# Patient Record
Sex: Male | Born: 1955 | Race: Black or African American | Hispanic: No | Marital: Married | Smoking: Former smoker
Health system: Southern US, Community
[De-identification: ages and names within clinical notes are randomized; demographics above are authoritative.]

## PROBLEM LIST (undated history)

## (undated) DIAGNOSIS — I10 Essential (primary) hypertension: Secondary | ICD-10-CM

## (undated) HISTORY — PX: OTHER SURGICAL HISTORY: SHX169

---

## 2003-09-04 ENCOUNTER — Ambulatory Visit (HOSPITAL_COMMUNITY): Admission: RE | Admit: 2003-09-04 | Discharge: 2003-09-04 | Payer: Self-pay | Admitting: Urology

## 2004-06-13 ENCOUNTER — Emergency Department (HOSPITAL_COMMUNITY): Admission: EM | Admit: 2004-06-13 | Discharge: 2004-06-13 | Payer: Self-pay | Admitting: Emergency Medicine

## 2004-06-22 ENCOUNTER — Ambulatory Visit (HOSPITAL_COMMUNITY): Admission: RE | Admit: 2004-06-22 | Discharge: 2004-06-22 | Payer: Self-pay | Admitting: Urology

## 2004-06-23 ENCOUNTER — Ambulatory Visit (HOSPITAL_BASED_OUTPATIENT_CLINIC_OR_DEPARTMENT_OTHER): Admission: RE | Admit: 2004-06-23 | Discharge: 2004-06-23 | Payer: Self-pay | Admitting: Urology

## 2004-10-12 ENCOUNTER — Ambulatory Visit (HOSPITAL_BASED_OUTPATIENT_CLINIC_OR_DEPARTMENT_OTHER): Admission: RE | Admit: 2004-10-12 | Discharge: 2004-10-12 | Payer: Self-pay | Admitting: Urology

## 2004-10-12 ENCOUNTER — Ambulatory Visit (HOSPITAL_COMMUNITY): Admission: RE | Admit: 2004-10-12 | Discharge: 2004-10-12 | Payer: Self-pay | Admitting: Urology

## 2006-11-08 IMAGING — CT CT ABDOMEN W/O CM
1 of 2 series · 15 of 32 positions shown, 19 images · non-contrast
Comparison: 09/04/03.

CLINICAL DATA: Rt sided flank pain.
TECHNIQUE: Multidetector helical scanning was performed without oral or intravenous contrast.  Lack of intravenous contrast limits the detection of pathology within the solid organs.

[Series 3436: — · axial · 0.69mm/px · z∈[+1396,+1826]mm · 15 of 96 slices shown, 19 images]
[im 5/96  soft-tissue]
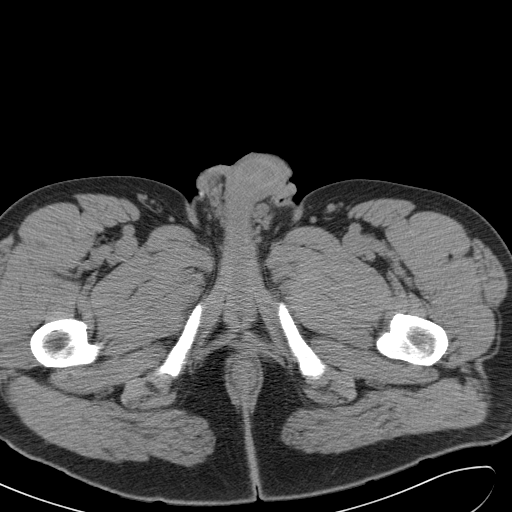
[im 5/96  bone]
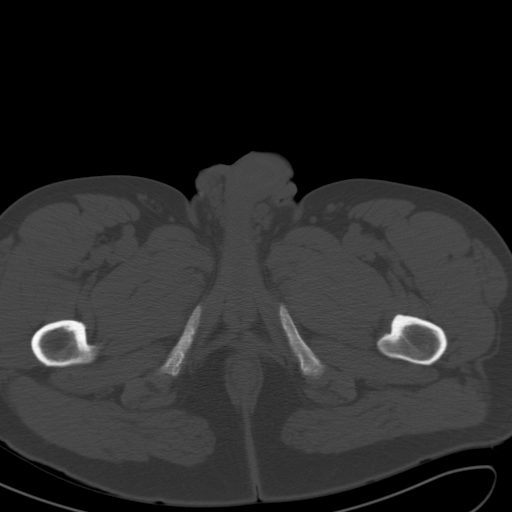
[im 13/96  soft-tissue]
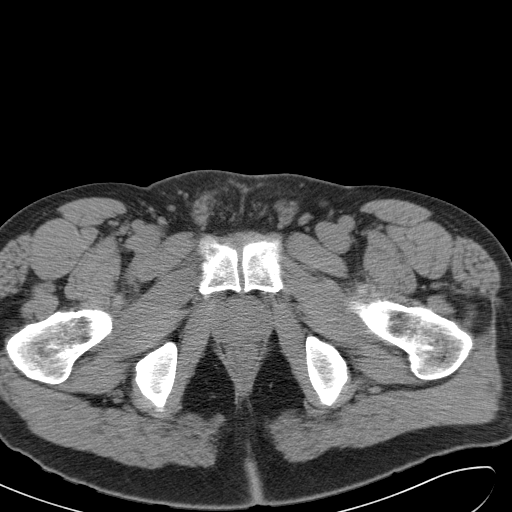
[im 21/96  soft-tissue]
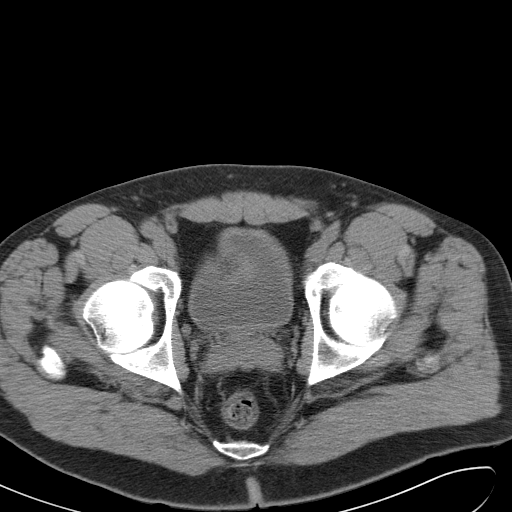
[im 25/96  soft-tissue]
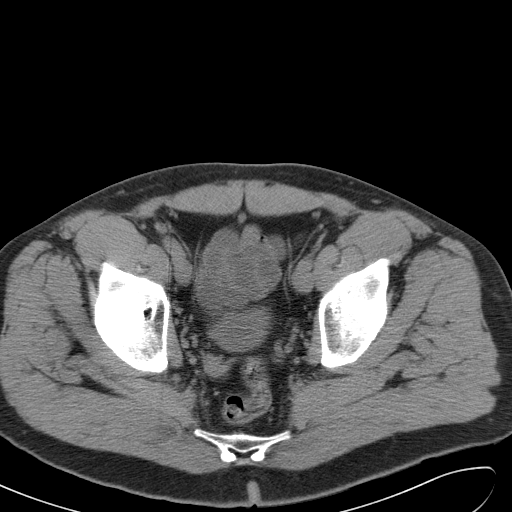
[im 34/96  soft-tissue]
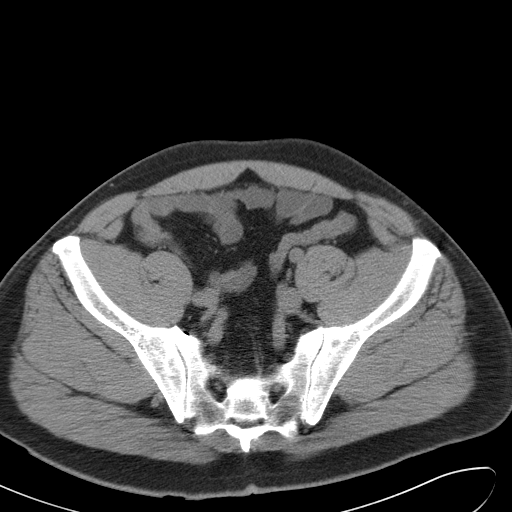
[im 42/96  soft-tissue]
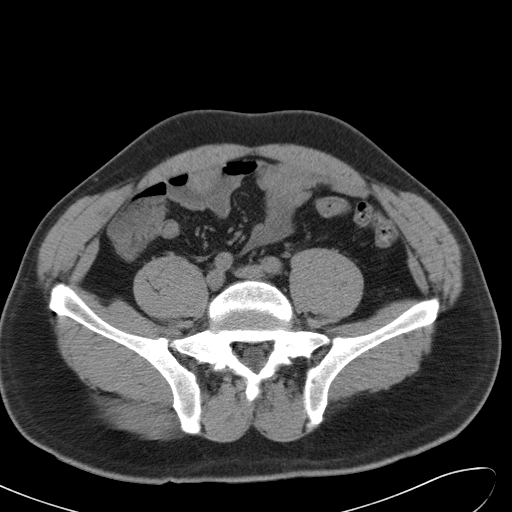
[im 50/96  soft-tissue]
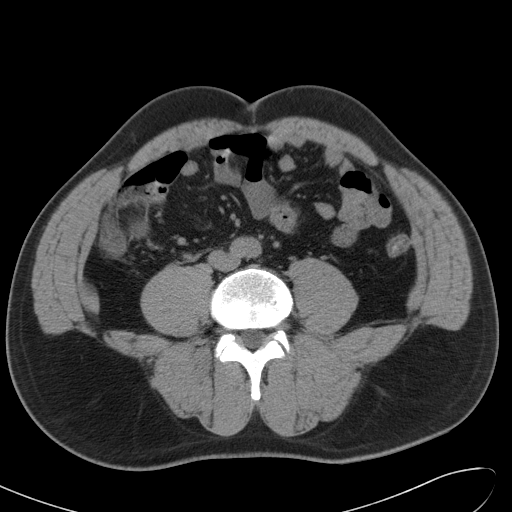
[im 54/96  soft-tissue]
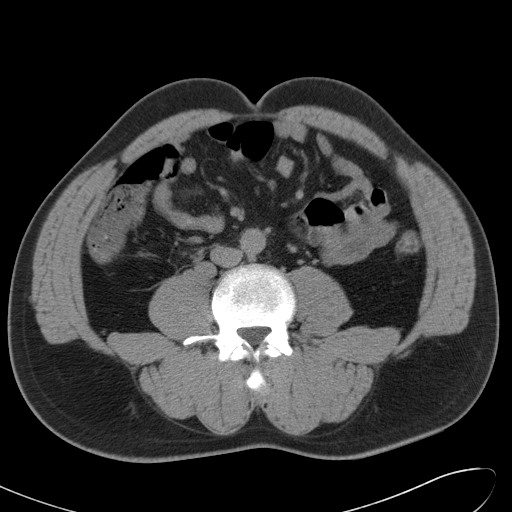
[im 62/96  soft-tissue]
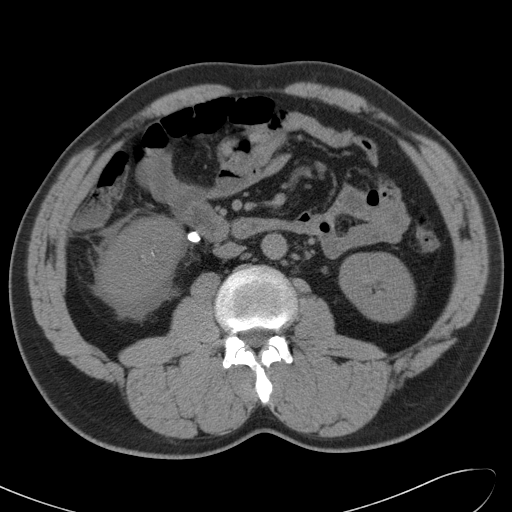
[im 62/96  bone]
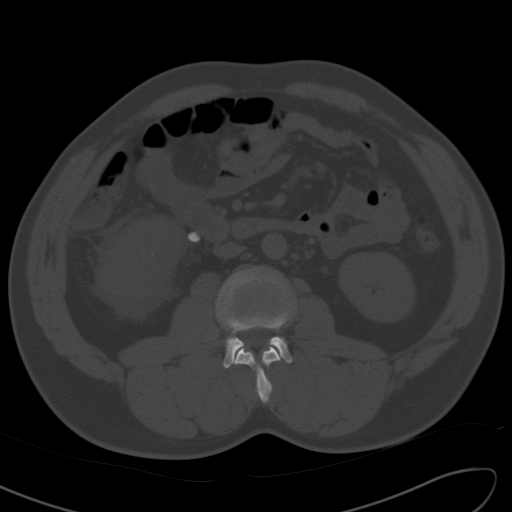
[im 71/96  soft-tissue]
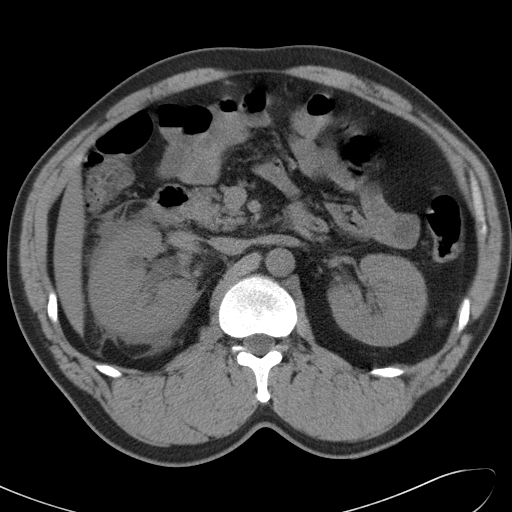
[im 75/96  soft-tissue]
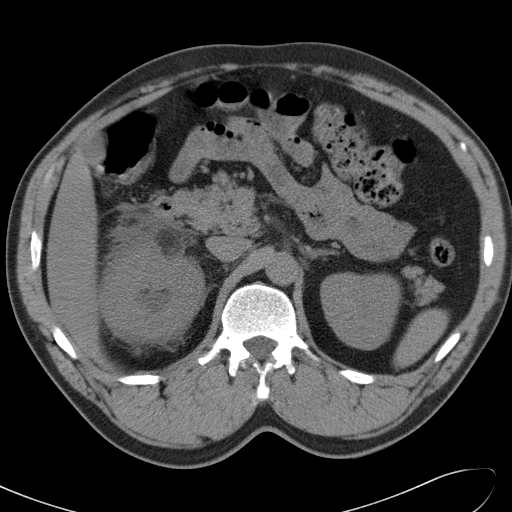
[im 79/96  lung]
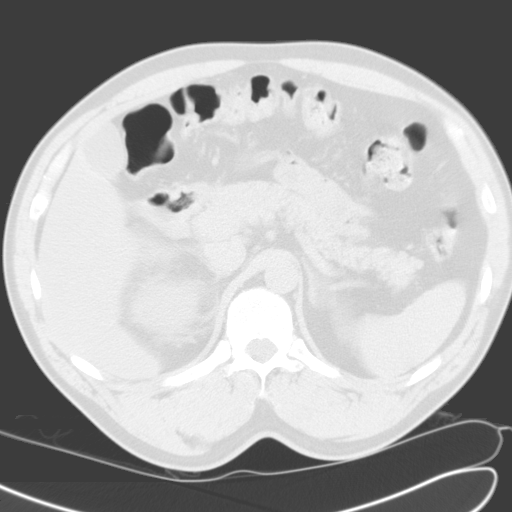
[im 83/96  soft-tissue]
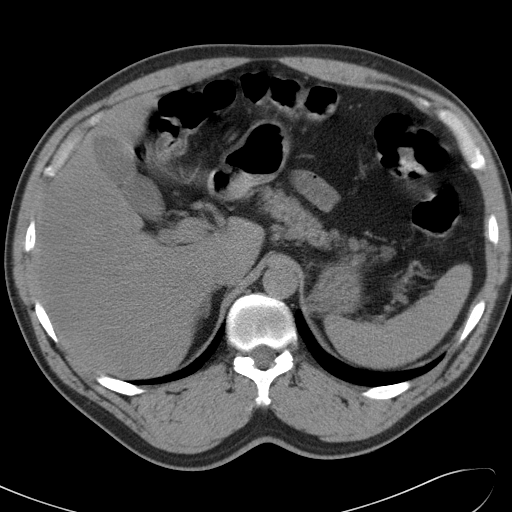
[im 83/96  lung]
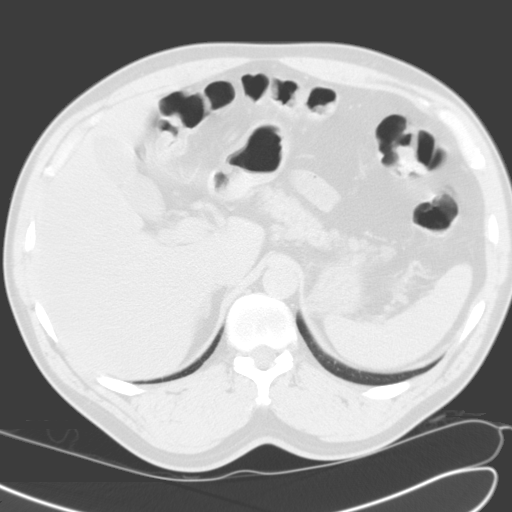
[im 87/96  lung]
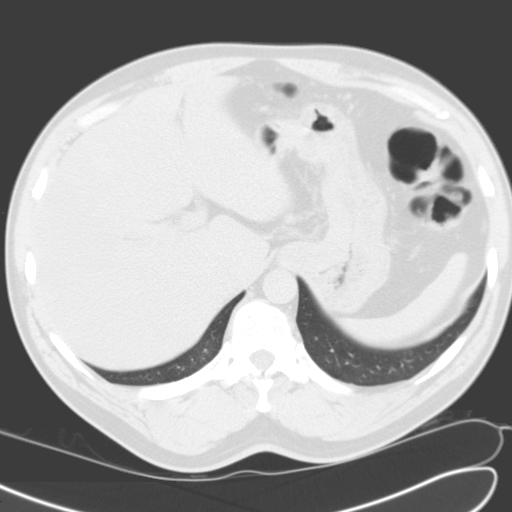
[im 91/96  soft-tissue]
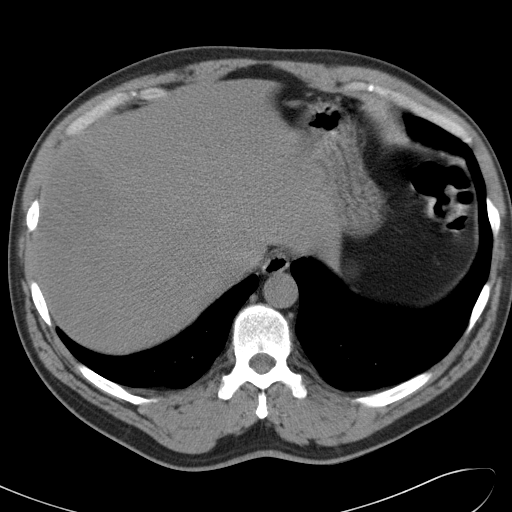
[im 91/96  lung]
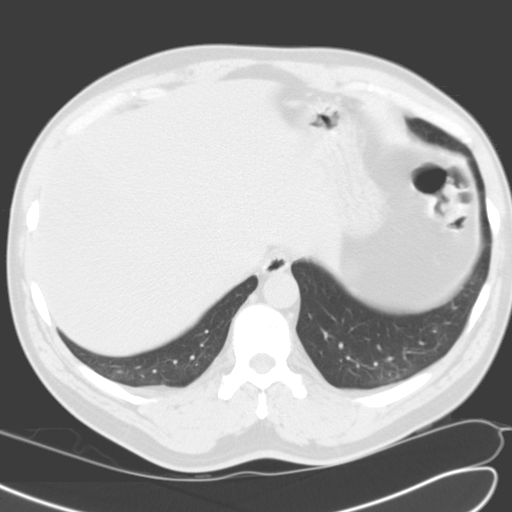

[15 of 32 positions shown; findings below may reference images not displayed]

CT ABDOMEN WITHOUT CONTRAST:
The unenhanced appearance of the liver, spleen, gallbladder and pancreas is unremarkable.  The left kidney contains at least two cysts which appear to be simple, the larger being 26 x 31 mm.  The right kidney is markedly hydronephrotic.  There are numerous calculi within the right kidney.  There is a stone lodged in the proximal right ureter measuring 8.6 x 9.9 mm.  There is significant perinephric stranding within Gerota?s fascia.  Small and large bowel are grossly unremarkable.   Findings represent a significant interval change from previous study which showed no evidence for hydronephrosis.
IMPRESSION: Right nephrolithiasis with significant hydronephrosis due to an 8 x 10 mm proximal right ureteral stone.  
PELVIS CT WITHOUT CONTRAST:

Non-contrast spiral CT of the pelvis was performed. 

There is no evidence of urinary tract calculi or ureterectasis. The other pelvic structures are unremarkable. There is no evidence of masses, inflammatory process, or abnormal fluid collections
IMPRESSION: Negative non-contrast pelvis CT.

## 2006-11-17 IMAGING — CR DG ABDOMEN 1V
1 series · 1 of 1 positions shown · non-contrast
Comparison: none

CLINICAL DATA: Right ureteral stone.  Right-sided pain.
 SINGLE VIEW OF THE ABDOMEN:
 AP view of the abdomen dated 06/22/2004 is compared to a CT study from 06/13/2004.
 The upper pole segment of the right kidney is not included on the film.  No urinary tract stone can be identified.  There is a moderate amount of stool overlying the right renal pelvis area.  Mucosal gas pattern is thought to be within normal limits and no focal abnormality of the abdomen is noted.

[t abdomen supine]
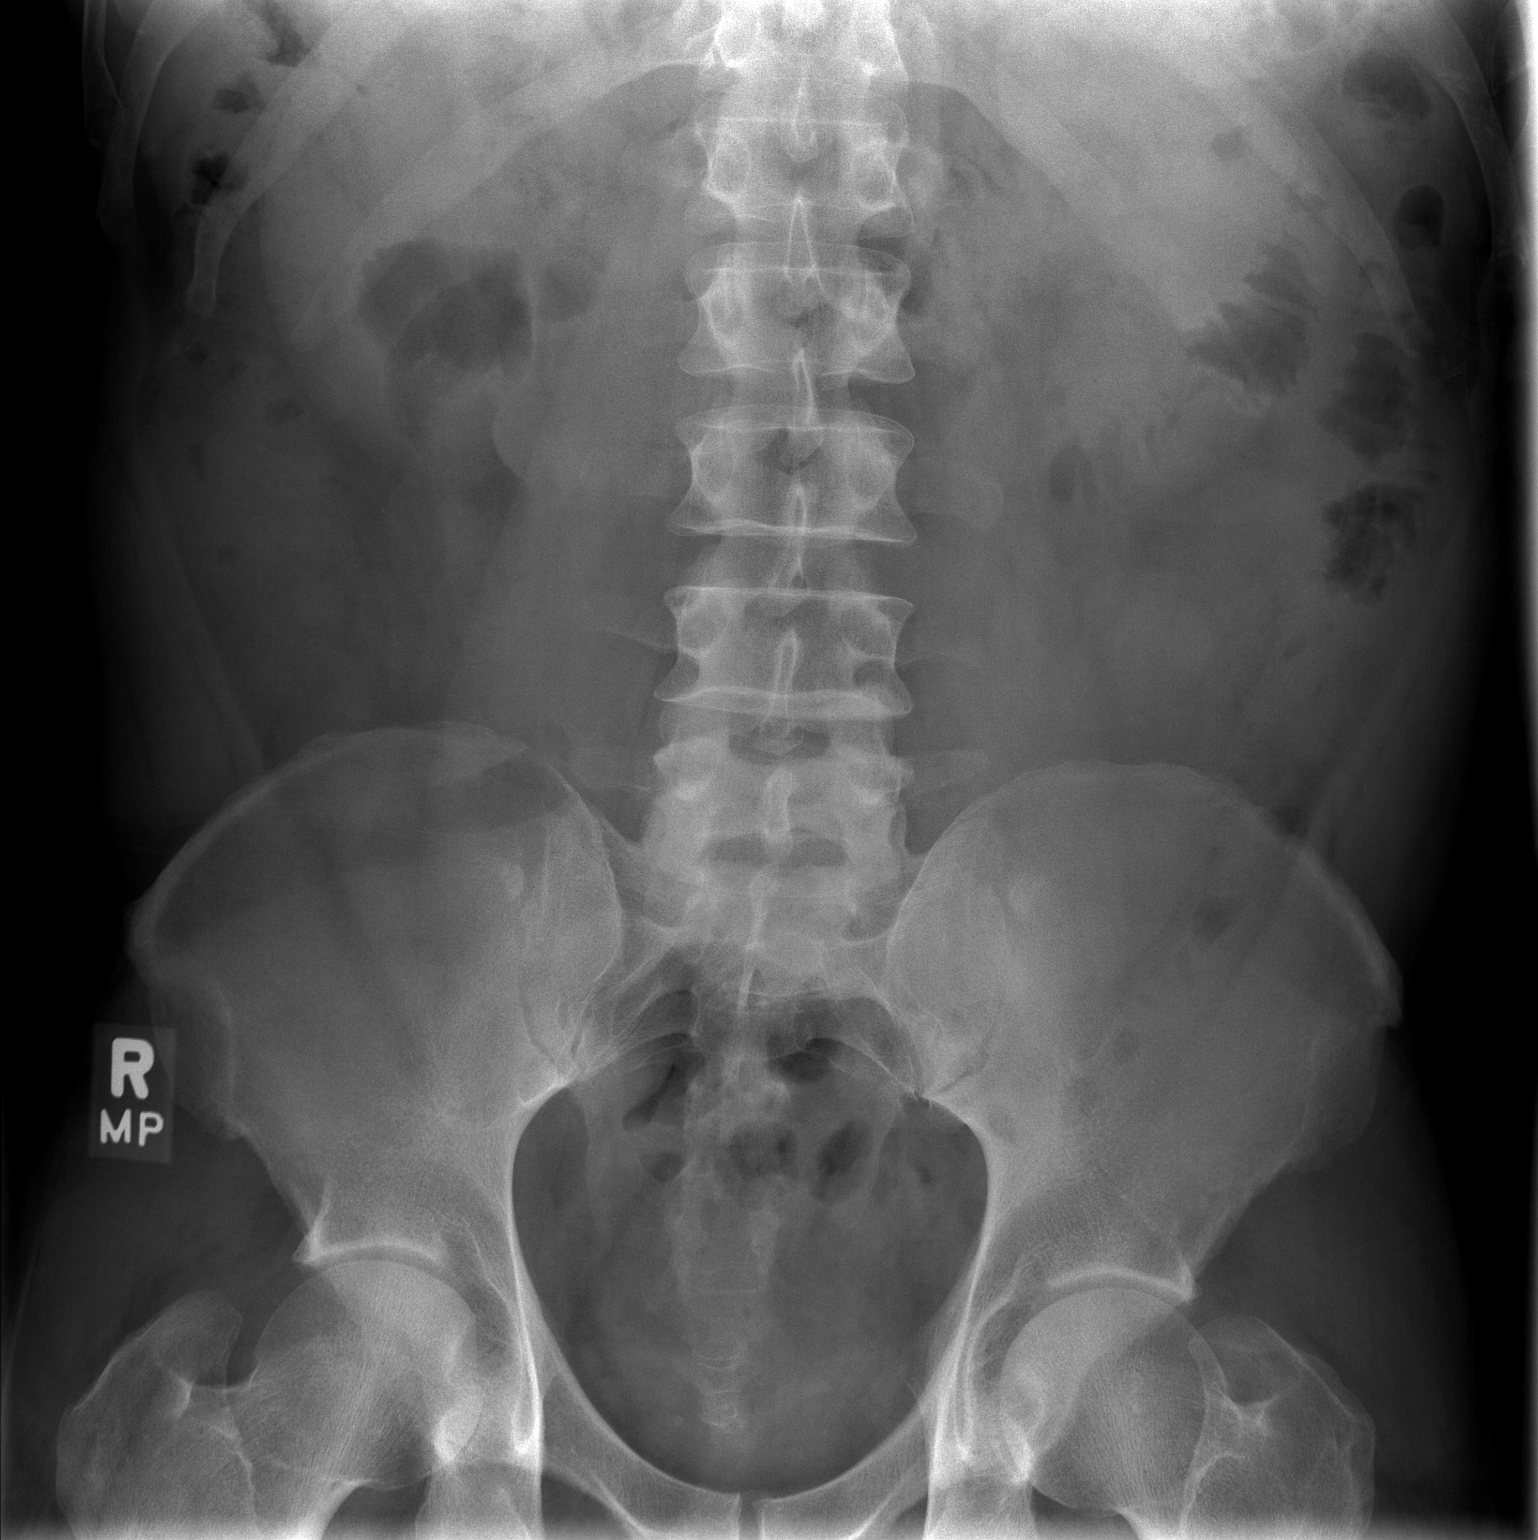

[1 of 1 positions shown; findings below may reference images not displayed]

IMPRESSION: No urinary tract stone can be identified with other discussion as above.

## 2010-04-19 ENCOUNTER — Encounter: Payer: Self-pay | Admitting: Urology

## 2011-12-14 NOTE — H&P (Signed)
  NTS SOAP Note  Vital Signs:  Vitals as of: 12/14/2011: Systolic 176: Diastolic 119: Heart Rate 73: Temp 96.29F: Height 52ft 0in: Weight 211Lbs 0 Ounces: BMI 29  BMI : 28.62 kg/m2  Subjective: This 89 Years 2 Months old Male presents for screening TCS.  Never has had a colonoscopy.  Denies any gi complaints.  No family h/o colon cancer.   Review of Symptoms:  Constitutional:unremarkable   Head:unremarkable    Eyes:unremarkable   Nose/Mouth/Throat:unremarkable Cardiovascular:  unremarkable   Respiratory:unremarkable   Gastrointestinal:  unremarkable   Genitourinary:unremarkable     Musculoskeletal:unremarkable   Skin:unremarkable Hematolgic/Lymphatic:unremarkable     Allergic/Immunologic:unremarkable     Past Medical History:    Reviewed   Past Medical History  Surgical History: unrremarkable Medical Problems: HTN Allergies: nkda Medications: metoprolol   Social History:Reviewed  Social History  Preferred Language: English ([place]) Ethnicity: Not Hispanic / Latino Age: 56 Years 2 Months Marital Status:  M Alcohol: 2 beers a day Recreational drug(s):  No   Smoking Status: Current every day smoker reviewed on 12/14/2011 Started Date: 03/29/1974 Packs per day: 0.50   Family History:  Reviewed   Family History  Is there a family history of:No family h/o colon carcinoma    Objective Information: General:  Well appearing, well nourished in no distress. Head:Atraumatic; no masses; no abnormalities Heart:  RRR, no murmur Lungs:    CTA bilaterally, no wheezes, rhonchi, rales.  Breathing unlabored. Abdomen:Soft, NT/ND, no HSM, no masses.   deferred to procedure  Assessment:Need for screening TCS  Diagnosis &amp; Procedure: DiagnosisCode: V76.51, ProcedureCode: [address],    Plan:Scheduled for TCS on 12/21/11.   Patient Education:Alternative treatments to surgery were discussed with  patient (and family).  Risks and benefits  of procedure were fully explained to the patient (and family) who gave informed consent. Patient/family questions were addressed.  Follow-up:Pending Surgery

## 2011-12-17 ENCOUNTER — Encounter (HOSPITAL_COMMUNITY): Payer: Self-pay | Admitting: Pharmacy Technician

## 2011-12-21 ENCOUNTER — Encounter (HOSPITAL_COMMUNITY): Payer: Self-pay | Admitting: *Deleted

## 2011-12-21 ENCOUNTER — Encounter (HOSPITAL_COMMUNITY)
Admission: RE | Disposition: A | Discharge status: Home / Self Care | Payer: Self-pay | Source: Ambulatory Visit | Attending: General Surgery

## 2011-12-21 ENCOUNTER — Ambulatory Visit (HOSPITAL_COMMUNITY)
Admission: RE | Admit: 2011-12-21 | Discharge: 2011-12-21 | Disposition: A | Discharge status: Home / Self Care | Payer: BC Managed Care – PPO | Source: Ambulatory Visit | Attending: General Surgery | Admitting: General Surgery

## 2011-12-21 DIAGNOSIS — Z1211 Encounter for screening for malignant neoplasm of colon: Secondary | ICD-10-CM | POA: Insufficient documentation

## 2011-12-21 DIAGNOSIS — I10 Essential (primary) hypertension: Secondary | ICD-10-CM | POA: Insufficient documentation

## 2011-12-21 DIAGNOSIS — D128 Benign neoplasm of rectum: Secondary | ICD-10-CM | POA: Insufficient documentation

## 2011-12-21 HISTORY — PX: COLONOSCOPY: SHX5424

## 2011-12-21 HISTORY — DX: Essential (primary) hypertension: I10

## 2011-12-21 SURGERY — COLONOSCOPY
Anesthesia: Moderate Sedation

## 2011-12-21 MED ORDER — MEPERIDINE HCL 50 MG/ML IJ SOLN
INTRAMUSCULAR | Status: AC
  Filled 2011-12-21: qty 1

## 2011-12-21 MED ORDER — MIDAZOLAM HCL 5 MG/5ML IJ SOLN
INTRAMUSCULAR | Status: DC | PRN
  Administered 2011-12-21: 4 mg via INTRAVENOUS

## 2011-12-21 MED ORDER — MIDAZOLAM HCL 5 MG/5ML IJ SOLN
INTRAMUSCULAR | Status: AC
  Filled 2011-12-21: qty 10

## 2011-12-21 MED ORDER — SODIUM CHLORIDE 0.9 % IV SOLN: INTRAVENOUS | Status: DC

## 2011-12-21 MED ORDER — SODIUM CHLORIDE 0.45 % IV SOLN
INTRAVENOUS | Status: DC
  Administered 2011-12-21: 08:00:00 via INTRAVENOUS

## 2011-12-21 MED ORDER — MEPERIDINE HCL 25 MG/ML IJ SOLN
INTRAMUSCULAR | Status: DC | PRN
  Administered 2011-12-21: 50 mg via INTRAVENOUS

## 2011-12-21 NOTE — Op Note (Addendum)
[facility] [address] [place], [address]   COLONOSCOPY PROCEDURE REPORT  PATIENT: Yeremi, Loria  MR#: [id number] BIRTHDATE: 05-16-1955 , 56  yrs. old GENDER: Male ENDOSCOPIST: Franky Macho, MD REFERRED [id number], William PROCEDURE DATE:  12/21/2011 PROCEDURE:   Colonoscopy with snare polypectomy ASA CLASS:   Class I INDICATIONS:average risk patient for colon cancer. MEDICATIONS: Versed 4 mg IV and Demerol 50 mg IV  DESCRIPTION OF PROCEDURE:   After the risks benefits and alternatives of the procedure were thoroughly explained, informed consent was obtained.  A digital rectal exam revealed no abnormalities of the rectum.   The EC-[id number] ([id number])  endoscope was introduced through the anus and advanced to the cecum, which was identified by both the appendix and ileocecal valve. No adverse events experienced.   The quality of the prep was adequate, using MoviPrep  The instrument was then slowly withdrawn as the colon was fully examined.      COLON FINDINGS: A sessile polyp ranging between 3-94mm in size with a friable surface was found in the rectum.  A polypectomy was performed using snare cautery.  The resection was complete and the polyp tissue was completely retrieved.  Retroflexed views revealed no abnormalities. The time to cecum=5 minutes 0 seconds. Withdrawal time=5 minutes 0 seconds.  The scope was withdrawn and the procedure completed. COMPLICATIONS: There were no complications.  ENDOSCOPIC IMPRESSION: Sessile polyp ranging between 3-64mm in size was found in the rectum; polypectomy was performed using snare cautery  RECOMMENDATIONS: 1.  await pathology results 2.  repeat Colonoscopy in 3 years.   eSigned:  Franky Macho, MD 12/21/2011 8:55 AM   cc:

## 2011-12-21 NOTE — Interval H&P Note (Signed)
History and Physical Interval Note:  12/21/2011 8:38 AM  Calvin Cox  has presented today for surgery, with the diagnosis of Special screening for malignant neoplasms, colon   The various methods of treatment have been discussed with the patient and family. After consideration of risks, benefits and other options for treatment, the patient has consented to  Procedure(s) (LRB) with comments: COLONOSCOPY (N/A) as a surgical intervention .  The patient's history has been reviewed, patient examined, no change in status, stable for surgery.  I have reviewed the patient's chart and labs.  Questions were answered to the patient's satisfaction.     [name]

## 2011-12-27 ENCOUNTER — Encounter (HOSPITAL_COMMUNITY): Payer: Self-pay | Admitting: General Surgery

## 2018-06-12 ENCOUNTER — Ambulatory Visit: Payer: BLUE CROSS/BLUE SHIELD | Admitting: Orthopedic Surgery

## 2018-06-12 ENCOUNTER — Encounter: Payer: Self-pay | Admitting: Orthopedic Surgery

## 2018-06-12 ENCOUNTER — Other Ambulatory Visit: Payer: Self-pay

## 2018-06-12 ENCOUNTER — Ambulatory Visit (INDEPENDENT_AMBULATORY_CARE_PROVIDER_SITE_OTHER): Payer: BLUE CROSS/BLUE SHIELD

## 2018-06-12 VITALS — BP 198/102 | HR 82 | Ht 74.0 in | Wt 220.0 lb

## 2018-06-12 DIAGNOSIS — M25561 Pain in right knee: Secondary | ICD-10-CM

## 2018-06-12 DIAGNOSIS — M25461 Effusion, right knee: Secondary | ICD-10-CM | POA: Diagnosis not present

## 2018-06-12 DIAGNOSIS — M1710 Unilateral primary osteoarthritis, unspecified knee: Secondary | ICD-10-CM | POA: Diagnosis not present

## 2018-06-12 NOTE — Progress Notes (Signed)
Patient ID: Calvin Cox, male   DOB: 28-Jul-1955, 63 y.o.   MRN: [id number]  Chief Complaint  Patient presents with  . Knee Pain    right    63 year old male history of hypertension got up off the commode a week and 2 days ago felt acute pain and swelling in his right knee.  It only hurts when he bends his knee and the pain is lessening.  He does note that he cannot bend his knee as much as he could and he does have some pain on the medial joint line but no mechanical symptoms  No prior treatment   Review of Systems  Respiratory: Negative for shortness of breath.   Cardiovascular: Negative for chest pain.  Neurological: Negative for tingling.    Past Medical History:  Diagnosis Date  . Hypertension     Past Surgical History:  Procedure Laterality Date  . COLONOSCOPY  12/21/2011   Procedure: COLONOSCOPY;  Surgeon: Jamesetta So, MD;  Location: [facility];  Service: Gastroenterology;  Laterality: N/A;  . cystoscopy with stent placement      PHYSICAL EXAM  BP (!) 198/102   Pulse 82   Ht 6\' 2"  (1.88 m)   Wt 220 lb (99.8 kg)   BMI 28.25 kg/m  GENERAL appearance reveals no gross abnormalities, normal development grooming and hygiene   MENTAL STATUS we note that the patient is awake alert and oriented to person place and time MOOD/AFFECT ARE NORMAL   GAIT reveals slight right lower extremity limp in the effected limb  Right Knee Exam   Muscle Strength  The patient has normal right knee strength.  Tenderness  The patient is experiencing tenderness in the medial joint line.  Range of Motion  Extension:  5 normal  Flexion:  90 normal   Tests  McMurray:  Lateral - negative Varus: negative Valgus: negative Drawer:  Anterior - negative    Posterior - negative  Other  Erythema: absent Scars: absent Sensation: normal Pulse: present Swelling: none (Large joint effusion) Effusion: effusion present   Left Knee Exam   Muscle Strength  The patient has normal left  knee strength.  Tenderness  The patient is experiencing no tenderness.   Range of Motion  Extension: normal  Flexion: normal   Tests  McMurray:  Medial - negative Lateral - negative Varus: negative Valgus: negative Drawer:  Anterior - negative     Posterior - negative  Other  Erythema: absent Scars: absent Sensation: normal Pulse: present Swelling: none     VASC 2+ dorsalis pedis pulse normal capillary refill excellent warmth to the extremity  NEURO normal sensation and no pathologic reflexes  LYMPH deferred noncontributory   MEDICAL DECISION SECTION  See dictated x-ray report  NO FRACTURE NO OA  + EFFUSION   Encounter Diagnoses  Name Primary?  . Acute pain of right knee Yes  . Effusion of knee joint right   . Primary localized osteoarthritis of knee      PLAN:   No orders of the defined types were placed in this encounter.   Procedure note injection and aspiration right knee joint  Verbal consent was obtained to aspirate and inject the right knee joint   Timeout was completed to confirm the site of aspiration and injection  An 18-gauge needle was used to aspirate the knee joint from a suprapatellar lateral approach.  The medications used were 40 mg of Depo-Medrol and 1% lidocaine 3 cc  Anesthesia was provided by ethyl chloride  and the skin was prepped with alcohol.  After cleaning the skin with alcohol an 18-gauge needle was used to aspirate the right knee joint.  We obtained 55  cc of fluid  We follow this by injection of 40 mg of Depo-Medrol and 3 cc 1% lidocaine.  There were no complications. A sterile bandage was applied.    I am going to recommend an anti-inflammatory ice activity modification Ace wrap and a follow-up appointment

## 2018-06-12 NOTE — Patient Instructions (Signed)
Ice the knee 3-4 times a day  Take Aleve 1 twice a day or ibuprofen 2 tablets 3 times a day  Try to reduce the amount of time you are up on your knee  Return 2 weeks

## 2018-06-26 ENCOUNTER — Other Ambulatory Visit: Payer: Self-pay

## 2018-06-26 ENCOUNTER — Ambulatory Visit (INDEPENDENT_AMBULATORY_CARE_PROVIDER_SITE_OTHER): Payer: BLUE CROSS/BLUE SHIELD | Admitting: Orthopedic Surgery

## 2018-06-26 DIAGNOSIS — M1710 Unilateral primary osteoarthritis, unspecified knee: Secondary | ICD-10-CM | POA: Diagnosis not present

## 2018-06-26 DIAGNOSIS — M25461 Effusion, right knee: Secondary | ICD-10-CM

## 2018-06-26 NOTE — Patient Instructions (Signed)
Economy hinged brace on Wednesday, April 1 9:40 AM nurse will fit for brace no charges needed, still needs to be screened for coronavirus and patient aware

## 2018-06-26 NOTE — Progress Notes (Signed)
I connected with Axell Kolodziej on 06/26/18 at 11:40 AM EDT by telephone and verified that I am speaking with the correct person using two identifiers.   I discussed the limitations, risks, security and privacy concerns of performing an evaluation and management service by telephone and the availability of in person appointments. I also discussed with the patient that there may be a patient responsible charge related to this service. The patient expressed understanding and agreed to proceed.   Progress Note   Patient ID: Calvin Cox, male   DOB: [birth year], 63 y.o.   MRN: [id number]   Chief complaint right knee pain  63 year old male history of hypertension got up off the commode a week and 2 days ago felt acute pain and swelling in his right knee.  It only hurts when he bends his knee and the pain is lessening.  He does note that he cannot bend his knee as much as he could and he does have some pain on the medial joint line but no mechanical symptoms  Mr. Huskins was treated with the injection recommended ice rest modified activities he presents on the telephone now for follow-up virtual visit secondary to COVID-19  He says his knee feels better except when he bends it he has pain.  This is relieved by using an Ace bandage.  He is inquiring about a brace      ROS no new findings.  The swelling is gone down he has no radiating pain from the proximal hip or distal ankle   No Known Allergies   There were no vitals taken for this visit.  Physical Exam   Medical decisions:   Data  Imaging:   No new imaging  No diagnosis found.  PLAN:   We discussed go ahead and fitting him for a brace in the office since [place] is not allowing patient and without severe conditions or necessary prescriptions.  Recommend economy hinged brace 9:40 AM on Wednesday morning    Arther Abbott, MD 06/26/2018 11:04 AM  Follow Up Instructions:    I discussed the assessment and  treatment plan with the patient. The patient was provided an opportunity to ask questions and all were answered. The patient agreed with the plan and demonstrated an understanding of the instructions.   The patient was advised to call back or seek an in-person evaluation if the symptoms worsen or if the condition fails to improve as anticipated.  I provided 6.5 minutes of non-face-to-face time during this encounter.

## 2019-05-20 ENCOUNTER — Ambulatory Visit: Payer: Self-pay | Attending: Internal Medicine

## 2019-05-20 DIAGNOSIS — Z23 Encounter for immunization: Secondary | ICD-10-CM | POA: Insufficient documentation

## 2019-05-20 NOTE — Progress Notes (Signed)
   Covid-19 Vaccination Clinic  Name:  Calvin Cox    MRN: [id number] DOB: 05-09-1955  05/20/2019  Mr. Calvin Cox was observed post Covid-19 immunization for 15 minutes without incidence. He was provided with Vaccine Information Sheet and instruction to access the V-Safe system.   Mr. Calvin Cox was instructed to call 911 with any severe reactions post vaccine: Marland Kitchen Difficulty breathing  . Swelling of your face and throat  . A fast heartbeat  . A bad rash all over your body  . Dizziness and weakness    Immunizations Administered    Name Date Dose VIS Date Route   Pfizer COVID-19 Vaccine 05/20/2019  2:44 PM 0.3 mL 03/09/2019 Intramuscular   Manufacturer: [place]   Lot: [id number]   [place]: [id number]

## 2019-06-13 ENCOUNTER — Ambulatory Visit: Payer: Self-pay | Attending: Internal Medicine

## 2019-06-13 DIAGNOSIS — Z23 Encounter for immunization: Secondary | ICD-10-CM

## 2019-06-13 NOTE — Progress Notes (Signed)
   Covid-19 Vaccination Clinic  Name:  Calvin Cox    MRN: [id number] DOB: 1956-01-18  06/13/2019  [name] was observed post Covid-19 immunization for 15 minutes without incident. He was provided with Vaccine Information Sheet and instruction to access the V-Safe system.   [name] was instructed to call 911 with any severe reactions post vaccine: Marland Kitchen Difficulty breathing  . Swelling of face and throat  . A fast heartbeat  . A bad rash all over body  . Dizziness and weakness   Immunizations Administered    Name Date Dose VIS Date Route   Pfizer COVID-19 Vaccine 06/13/2019  8:51 AM 0.3 mL 03/09/2019 Intramuscular   Manufacturer: [place]   Lot: [id number]   [place]: [id number]

## 2022-06-10 ENCOUNTER — Other Ambulatory Visit (HOSPITAL_COMMUNITY): Payer: Self-pay | Admitting: Family Medicine

## 2022-06-10 DIAGNOSIS — R748 Abnormal levels of other serum enzymes: Secondary | ICD-10-CM

## 2022-06-22 ENCOUNTER — Ambulatory Visit (HOSPITAL_COMMUNITY)
Admission: RE | Admit: 2022-06-22 | Discharge: 2022-06-22 | Disposition: A | Discharge status: Home / Self Care | Payer: Medicare Other | Source: Ambulatory Visit | Attending: Family Medicine | Admitting: Family Medicine

## 2022-06-22 DIAGNOSIS — R748 Abnormal levels of other serum enzymes: Secondary | ICD-10-CM | POA: Insufficient documentation
